# Patient Record
Sex: Female | Born: 1985 | Race: White | Hispanic: No | Marital: Single | Smoking: Current some day smoker
Health system: Southern US, Community
[De-identification: ages and names within clinical notes are randomized; demographics above are authoritative.]

## PROBLEM LIST (undated history)

## (undated) DIAGNOSIS — F329 Major depressive disorder, single episode, unspecified: Secondary | ICD-10-CM

## (undated) DIAGNOSIS — F4310 Post-traumatic stress disorder, unspecified: Secondary | ICD-10-CM

## (undated) DIAGNOSIS — F419 Anxiety disorder, unspecified: Secondary | ICD-10-CM

## (undated) DIAGNOSIS — N809 Endometriosis, unspecified: Secondary | ICD-10-CM

## (undated) DIAGNOSIS — F32A Depression, unspecified: Secondary | ICD-10-CM

---
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
## 2009-05-14 ENCOUNTER — Ambulatory Visit: Payer: Self-pay | Admitting: Internal Medicine

## 2014-06-22 ENCOUNTER — Telehealth: Payer: Self-pay | Admitting: *Deleted

## 2014-06-22 ENCOUNTER — Emergency Department (HOSPITAL_COMMUNITY)
Admission: EM | Admit: 2014-06-22 | Discharge: 2014-06-22 | Disposition: A | Discharge status: Home / Self Care | Payer: BLUE CROSS/BLUE SHIELD | Attending: Emergency Medicine | Admitting: Emergency Medicine

## 2014-06-22 ENCOUNTER — Emergency Department (HOSPITAL_COMMUNITY): Payer: BLUE CROSS/BLUE SHIELD

## 2014-06-22 ENCOUNTER — Encounter (HOSPITAL_COMMUNITY): Payer: Self-pay

## 2014-06-22 DIAGNOSIS — N3000 Acute cystitis without hematuria: Secondary | ICD-10-CM | POA: Insufficient documentation

## 2014-06-22 DIAGNOSIS — R0602 Shortness of breath: Secondary | ICD-10-CM

## 2014-06-22 DIAGNOSIS — R42 Dizziness and giddiness: Secondary | ICD-10-CM

## 2014-06-22 DIAGNOSIS — Z79899 Other long term (current) drug therapy: Secondary | ICD-10-CM | POA: Diagnosis not present

## 2014-06-22 DIAGNOSIS — Z791 Long term (current) use of non-steroidal anti-inflammatories (NSAID): Secondary | ICD-10-CM | POA: Diagnosis not present

## 2014-06-22 DIAGNOSIS — R5383 Other fatigue: Secondary | ICD-10-CM | POA: Diagnosis not present

## 2014-06-22 DIAGNOSIS — Z3202 Encounter for pregnancy test, result negative: Secondary | ICD-10-CM | POA: Insufficient documentation

## 2014-06-22 DIAGNOSIS — Z720 Tobacco use: Secondary | ICD-10-CM | POA: Insufficient documentation

## 2014-06-22 HISTORY — DX: Depression, unspecified: F32.A

## 2014-06-22 HISTORY — DX: Anxiety disorder, unspecified: F41.9

## 2014-06-22 HISTORY — DX: Major depressive disorder, single episode, unspecified: F32.9

## 2014-06-22 HISTORY — DX: Endometriosis, unspecified: N80.9

## 2014-06-22 LAB — URINALYSIS, ROUTINE W REFLEX MICROSCOPIC
Bilirubin Urine: NEGATIVE
Glucose, UA: NEGATIVE mg/dL
KETONES UR: NEGATIVE mg/dL
Nitrite: NEGATIVE
PROTEIN: 30 mg/dL — AB
Specific Gravity, Urine: 1.022 (ref 1.005–1.030)
Urobilinogen, UA: 1 mg/dL (ref 0.0–1.0)
pH: 7 (ref 5.0–8.0)

## 2014-06-22 LAB — BASIC METABOLIC PANEL
Anion gap: 13 (ref 5–15)
BUN: 14 mg/dL (ref 6–23)
CO2: 20 mmol/L (ref 19–32)
Calcium: 9.7 mg/dL (ref 8.4–10.5)
Chloride: 106 mmol/L (ref 96–112)
Creatinine, Ser: 0.71 mg/dL (ref 0.50–1.10)
GFR calc Af Amer: >90 mL/min (ref >90)
GFR calc non Af Amer: >90 mL/min (ref >90)
Glucose, Bld: 111 mg/dL — ABNORMAL HIGH (ref 70–99)
Potassium: 3.5 mmol/L (ref 3.5–5.1)
Sodium: 139 mmol/L (ref 135–145)

## 2014-06-22 LAB — URINE MICROSCOPIC-ADD ON

## 2014-06-22 LAB — CBC
HCT: 39.5 % (ref 36.0–46.0)
Hemoglobin: 13.7 g/dL (ref 12.0–15.0)
MCH: 31.5 pg (ref 26.0–34.0)
MCHC: 34.7 g/dL (ref 30.0–36.0)
MCV: 90.8 fL (ref 78.0–100.0)
Platelets: 221 10*3/uL (ref 150–400)
RBC: 4.35 MIL/uL (ref 3.87–5.11)
RDW: 11.9 % (ref 11.5–15.5)
WBC: 6.1 10*3/uL (ref 4.0–10.5)

## 2014-06-22 LAB — POC URINE PREG, ED: Preg Test, Ur: NEGATIVE

## 2014-06-22 MED ORDER — SODIUM CHLORIDE 0.9 % IV BOLUS (SEPSIS)
1000.0000 mL | Freq: Once | INTRAVENOUS | Status: AC
  Administered 2014-06-22: 1000 mL via INTRAVENOUS

## 2014-06-22 MED ORDER — MECLIZINE HCL 32 MG PO TABS: 32.0000 mg | ORAL_TABLET | Freq: Three times a day (TID) | ORAL | Status: AC | PRN

## 2014-06-22 MED ORDER — PROCHLORPERAZINE EDISYLATE 5 MG/ML IJ SOLN
10.0000 mg | Freq: Once | INTRAMUSCULAR | Status: AC
  Administered 2014-06-22: 10 mg via INTRAVENOUS
  Filled 2014-06-22: qty 2

## 2014-06-22 MED ORDER — CEPHALEXIN 500 MG PO CAPS: 500.0000 mg | ORAL_CAPSULE | Freq: Three times a day (TID) | ORAL | Status: AC

## 2014-06-22 MED ORDER — LORAZEPAM 2 MG/ML IJ SOLN
0.5000 mg | Freq: Once | INTRAMUSCULAR | Status: AC
  Administered 2014-06-22: 0.5 mg via INTRAVENOUS
  Filled 2014-06-22: qty 1

## 2014-06-22 MED ORDER — DIPHENHYDRAMINE HCL 50 MG/ML IJ SOLN
25.0000 mg | Freq: Once | INTRAMUSCULAR | Status: AC
  Administered 2014-06-22: 25 mg via INTRAVENOUS
  Filled 2014-06-22: qty 1

## 2014-06-22 MED ORDER — LIDOCAINE HCL (PF) 1 % IJ SOLN
INTRAMUSCULAR | Status: AC
  Administered 2014-06-22: 1.6 mL
  Filled 2014-06-22: qty 5

## 2014-06-22 MED ORDER — CEFTRIAXONE SODIUM 1 G IJ SOLR
1.0000 g | Freq: Once | INTRAMUSCULAR | Status: AC
  Administered 2014-06-22: 1 g via INTRAMUSCULAR
  Filled 2014-06-22: qty 10

## 2014-06-22 MED ORDER — KETOROLAC TROMETHAMINE 30 MG/ML IJ SOLN
30.0000 mg | Freq: Once | INTRAMUSCULAR | Status: AC
  Administered 2014-06-22: 30 mg via INTRAVENOUS
  Filled 2014-06-22 (×2): qty 1

## 2014-06-22 NOTE — ED Notes (Signed)
Pt alert, oriented, and ambulatory upon DC.  She was advised to follow up with PCP. 

## 2014-06-22 NOTE — Discharge Instructions (Signed)
Dizziness °Dizziness is a common problem. It is a feeling of unsteadiness or light-headedness. You may feel like you are about to faint. Dizziness can lead to injury if you stumble or fall. A person of any age group can suffer from dizziness, but dizziness is more common in older adults. °CAUSES  °Dizziness can be caused by many different things, including: °· Middle ear problems. °· Standing for too long. °· Infections. °· An allergic reaction. °· Aging. °· An emotional response to something, such as the sight of blood. °· Side effects of medicines. °· Tiredness. °· Problems with circulation or blood pressure. °· Excessive use of alcohol or medicines, or illegal drug use. °· Breathing too fast (hyperventilation). °· An irregular heart rhythm (arrhythmia). °· A low red blood cell count (anemia). °· Pregnancy. °· Vomiting, diarrhea, fever, or other illnesses that cause body fluid loss (dehydration). °· Diseases or conditions such as Parkinson's disease, high blood pressure (hypertension), diabetes, and thyroid problems. °· Exposure to extreme heat. °DIAGNOSIS  °Your health care provider will ask about your symptoms, perform a physical exam, and perform an electrocardiogram (ECG) to record the electrical activity of your heart. Your health care provider may also perform other heart or blood tests to determine the cause of your dizziness. These may include: °· Transthoracic echocardiogram (TTE). During echocardiography, sound waves are used to evaluate how blood flows through your heart. °· Transesophageal echocardiogram (TEE). °· Cardiac monitoring. This allows your health care provider to monitor your heart rate and rhythm in real time. °· Holter monitor. This is a portable device that records your heartbeat and can help diagnose heart arrhythmias. It allows your health care provider to track your heart activity for several days if needed. °· Stress tests by exercise or by giving medicine that makes the heart beat  faster. °TREATMENT  °Treatment of dizziness depends on the cause of your symptoms and can vary greatly. °HOME CARE INSTRUCTIONS  °· Drink enough fluids to keep your urine clear or pale yellow. This is especially important in very hot weather. In older adults, it is also important in cold weather. °· Take your medicine exactly as directed if your dizziness is caused by medicines. When taking blood pressure medicines, it is especially important to get up slowly. °· Rise slowly from chairs and steady yourself until you feel okay. °· In the morning, first sit up on the side of the bed. When you feel okay, stand slowly while holding onto something until you know your balance is fine. °· Move your legs often if you need to stand in one place for a long time. Tighten and relax your muscles in your legs while standing. °· Have someone stay with you for 1-2 days if dizziness continues to be a problem. Do this until you feel you are well enough to stay alone. Have the person call your health care provider if he or she notices changes in you that are concerning. °· Do not drive or use heavy machinery if you feel dizzy. °· Do not drink alcohol. °SEEK IMMEDIATE MEDICAL CARE IF:  °· Your dizziness or light-headedness gets worse. °· You feel nauseous or vomit. °· You have problems talking, walking, or using your arms, hands, or legs. °· You feel weak. °· You are not thinking clearly or you have trouble forming sentences. It may take a friend or family member to notice this. °· You have chest pain, abdominal pain, shortness of breath, or sweating. °· Your vision changes. °· You notice   any bleeding.  You have side effects from medicine that seems to be getting worse rather than better. MAKE SURE YOU:   Understand these instructions.  Will watch your condition.  Will get help right away if you are not doing well or get worse. Document Released: 09/17/2000 Document Revised: 03/29/2013 Document Reviewed: 10/11/2010 [facility]  Patient Information 2015 [place], [place]. This information is not intended to replace advice given to you by your health care provider. Make sure you discuss any questions you have with your health care provider.  Urinary Tract Infection Urinary tract infections (UTIs) can develop anywhere along your urinary tract. Your urinary tract is your body's drainage system for removing wastes and extra water. Your urinary tract includes two kidneys, two ureters, a bladder, and a urethra. Your kidneys are a pair of bean-shaped organs. Each kidney is about the size of your fist. They are located below your ribs, one on each side of your spine. CAUSES Infections are caused by microbes, which are microscopic organisms, including fungi, viruses, and bacteria. These organisms are so small that they can only be seen through a microscope. Bacteria are the microbes that most commonly cause UTIs. SYMPTOMS  Symptoms of UTIs may vary by age and gender of the patient and by the location of the infection. Symptoms in young women typically include a frequent and intense urge to urinate and a painful, burning feeling in the bladder or urethra during urination. Older women and men are more likely to be tired, shaky, and weak and have muscle aches and abdominal pain. A fever may mean the infection is in your kidneys. Other symptoms of a kidney infection include pain in your back or sides below the ribs, nausea, and vomiting. DIAGNOSIS To diagnose a UTI, your caregiver will ask you about your symptoms. Your caregiver also will ask to provide a urine sample. The urine sample will be tested for bacteria and white blood cells. White blood cells are made by your body to help fight infection. TREATMENT  Typically, UTIs can be treated with medication. Because most UTIs are caused by a bacterial infection, they usually can be treated with the use of antibiotics. The choice of antibiotic and length of treatment depend on your symptoms and  the type of bacteria causing your infection. HOME CARE INSTRUCTIONS  If you were prescribed antibiotics, take them exactly as your caregiver instructs you. Finish the medication even if you feel better after you have only taken some of the medication.  Drink enough water and fluids to keep your urine clear or pale yellow.  Avoid caffeine, tea, and carbonated beverages. They tend to irritate your bladder.  Empty your bladder often. Avoid holding urine for long periods of time.  Empty your bladder before and after sexual intercourse.  After a bowel movement, women should cleanse from front to back. Use each tissue only once. SEEK MEDICAL CARE IF:   You have back pain.  You develop a fever.  Your symptoms do not begin to resolve within 3 days. SEEK IMMEDIATE MEDICAL CARE IF:   You have severe back pain or lower abdominal pain.  You develop chills.  You have nausea or vomiting.  You have continued burning or discomfort with urination. MAKE SURE YOU:   Understand these instructions.  Will watch your condition.  Will get help right away if you are not doing well or get worse. Document Released: 01/01/2005 Document Revised: 09/23/2011 Document Reviewed: 05/02/2011 [facility] Patient Information 2015 [place], [place]. This information is not  intended to replace advice given to you by your health care provider. Make sure you discuss any questions you have with your health care provider.  Shortness of Breath Shortness of breath means you have trouble breathing. It could also mean that you have a medical problem. You should get immediate medical care for shortness of breath. CAUSES   Not enough oxygen in the air such as with high altitudes or a smoke-filled room.  Certain lung diseases, infections, or problems.  Heart disease or conditions, such as angina or heart failure.  Low red blood cells (anemia).  Poor physical fitness, which can cause shortness of breath when you  exercise.  Chest or back injuries or stiffness.  Being overweight.  Smoking.  Anxiety, which can make you feel like you are not getting enough air. DIAGNOSIS  Serious medical problems can often be found during your physical exam. Tests may also be done to determine why you are having shortness of breath. Tests may include:  Chest X-rays.  Lung function tests.  Blood tests.  An electrocardiogram (ECG).  An ambulatory electrocardiogram. An ambulatory ECG records your heartbeat patterns over a 24-hour period.  Exercise testing.  A transthoracic echocardiogram (TTE). During echocardiography, sound waves are used to evaluate how blood flows through your heart.  A transesophageal echocardiogram (TEE).  Imaging scans. Your health care provider may not be able to find a cause for your shortness of breath after your exam. In this case, it is important to have a follow-up exam with your health care provider as directed.  TREATMENT  Treatment for shortness of breath depends on the cause of your symptoms and can vary greatly. HOME CARE INSTRUCTIONS   Do not smoke. Smoking is a common cause of shortness of breath. If you smoke, ask for help to quit.  Avoid being around chemicals or things that may bother your breathing, such as paint fumes and dust.  Rest as needed. Slowly resume your usual activities.  If medicines were prescribed, take them as directed for the full length of time directed. This includes oxygen and any inhaled medicines.  Keep all follow-up appointments as directed by your health care provider. SEEK MEDICAL CARE IF:   Your condition does not improve in the time expected.  You have a hard time doing your normal activities even with rest.  You have any new symptoms. SEEK IMMEDIATE MEDICAL CARE IF:   Your shortness of breath gets worse.  You feel light-headed, faint, or develop a cough not controlled with medicines.  You start coughing up blood.  You have  pain with breathing.  You have chest pain or pain in your arms, shoulders, or abdomen.  You have a fever.  You are unable to walk up stairs or exercise the way you normally do. MAKE SURE YOU:  Understand these instructions.  Will watch your condition.  Will get help right away if you are not doing well or get worse. Document Released: 12/17/2000 Document Revised: 03/29/2013 Document Reviewed: 06/09/2011 [facility] Patient Information 2015 [place]. This information is not intended to replace advice given to you by your health care provider. Make sure you discuss any questions you have with your health care provider.

## 2014-06-22 NOTE — ED Notes (Signed)
Contacted by [facility] regarding prescription for Meclazine 32mg , dose not available.  Change Mecalzine dose to 25 mg per Langston MaskerKaren Sofia, [place]
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
## 2014-06-23 LAB — URINE CULTURE
Colony Count: NO GROWTH
Culture: NO GROWTH

## 2016-06-29 ENCOUNTER — Emergency Department (HOSPITAL_COMMUNITY): Payer: BLUE CROSS/BLUE SHIELD

## 2016-06-29 ENCOUNTER — Emergency Department (HOSPITAL_COMMUNITY)
Admission: EM | Admit: 2016-06-29 | Discharge: 2016-06-29 | Disposition: A | Discharge status: Home / Self Care | Payer: BLUE CROSS/BLUE SHIELD | Attending: Emergency Medicine | Admitting: Emergency Medicine

## 2016-06-29 ENCOUNTER — Encounter (HOSPITAL_COMMUNITY): Payer: Self-pay

## 2016-06-29 DIAGNOSIS — E860 Dehydration: Secondary | ICD-10-CM | POA: Diagnosis not present

## 2016-06-29 DIAGNOSIS — R5383 Other fatigue: Secondary | ICD-10-CM | POA: Diagnosis present

## 2016-06-29 DIAGNOSIS — F17210 Nicotine dependence, cigarettes, uncomplicated: Secondary | ICD-10-CM | POA: Diagnosis not present

## 2016-06-29 HISTORY — DX: Post-traumatic stress disorder, unspecified: F43.10

## 2016-06-29 LAB — CBC WITH DIFFERENTIAL/PLATELET
Basophils Absolute: 0 10*3/uL (ref 0.0–0.1)
Basophils Relative: 0 %
Eosinophils Absolute: 0 10*3/uL (ref 0.0–0.7)
Eosinophils Relative: 1 %
HCT: 40.1 % (ref 36.0–46.0)
Hemoglobin: 14.6 g/dL (ref 12.0–15.0)
Lymphocytes Relative: 34 %
Lymphs Abs: 2.3 10*3/uL (ref 0.7–4.0)
MCH: 31.7 pg (ref 26.0–34.0)
MCHC: 36.4 g/dL — ABNORMAL HIGH (ref 30.0–36.0)
MCV: 87 fL (ref 78.0–100.0)
Monocytes Absolute: 0.5 10*3/uL (ref 0.1–1.0)
Monocytes Relative: 7 %
Neutro Abs: 3.9 10*3/uL (ref 1.7–7.7)
Neutrophils Relative %: 58 %
Platelets: 246 10*3/uL (ref 150–400)
RBC: 4.61 MIL/uL (ref 3.87–5.11)
RDW: 11.4 % — ABNORMAL LOW (ref 11.5–15.5)
WBC: 6.8 10*3/uL (ref 4.0–10.5)

## 2016-06-29 LAB — I-STAT BETA HCG BLOOD, ED (MC, WL, AP ONLY): I-stat hCG, quantitative: <5 m[IU]/mL (ref <5)

## 2016-06-29 LAB — BASIC METABOLIC PANEL
Anion gap: 7 (ref 5–15)
BUN: 12 mg/dL (ref 6–20)
CO2: 26 mmol/L (ref 22–32)
Calcium: 10.1 mg/dL (ref 8.9–10.3)
Chloride: 105 mmol/L (ref 101–111)
Creatinine, Ser: 0.76 mg/dL (ref 0.44–1.00)
GFR calc Af Amer: >60 mL/min (ref >60)
GFR calc non Af Amer: >60 mL/min (ref >60)
Glucose, Bld: 96 mg/dL (ref 65–99)
Potassium: 3.6 mmol/L (ref 3.5–5.1)
Sodium: 138 mmol/L (ref 135–145)

## 2016-06-29 MED ORDER — LORAZEPAM 1 MG PO TABS
1.0000 mg | ORAL_TABLET | Freq: Once | ORAL | Status: AC
  Administered 2016-06-29: 1 mg via ORAL
  Filled 2016-06-29: qty 1

## 2016-06-29 MED ORDER — SODIUM CHLORIDE 0.9 % IV BOLUS (SEPSIS)
1000.0000 mL | Freq: Once | INTRAVENOUS | Status: AC
  Administered 2016-06-29: 1000 mL via INTRAVENOUS

## 2016-06-29 NOTE — ED Triage Notes (Signed)
Pt had oral surgery, wisdom out on Tuesday.  Today she feels she has been weak and dizzy.  Pt told to go to [facility]. Pt states they couldn't really do much so she was told to come here.  Pt states she feels cold then hot.  Pt has no n/v.  Pt has decreased intake d/t surgery.

## 2016-06-29 NOTE — ED Provider Notes (Signed)
[facility]-EMERGENCY DEPT Provider Note   CSN: [id number] Arrival date & time: 06/29/16  1154     History   Chief Complaint Chief Complaint  Patient presents with  . Weakness    HPI Beverly Lamb is a 31 y.o. female who presents today accompanied by husband with chief complaint generalized fatigue for 5 days. States she had her lower wisdom teeth removed on Tuesday and has been experiencing fatigue since then, accompanied by HA/dizziness with changing positions and occasional nausea, as well as chills. Denies syncope, vomiting. States she has been taking her percocet and ibuprofen as prescribed for pain and is not experiencing any discomfort from the surgical site. States she wants to sleep every 2 hours or so. She also states she has been eating and drinking about 1/3 of her normal PO intake. Called her orthodontist today who advised her to go to urgent care and states "they could not do anything for me there". Denies CP/SOB, abd pain, v/d, melena, hematochezia, dysuria, hematuria, back pain, myalgia, weakness.   The history is provided by the patient.    Past Medical History:  Diagnosis Date  . Anxiety   . Depression   . Endometriosis   . PTSD (post-traumatic stress disorder)     There are no active problems to display for this patient.   No past surgical history on file.  OB History    No data available       Home Medications    Prior to Admission medications   Medication Sig Start Date End Date Taking? Authorizing Provider  amoxicillin (AMOXIL) 875 MG tablet Take 875 mg by mouth every 12 (twelve) hours. 06/24/16  Yes Historical Provider, MD  HYDROcodone-acetaminophen (NORCO/VICODIN) 5-325 MG per tablet Take 1 tablet by mouth every 6 (six) hours as needed for moderate pain.   Yes Historical Provider, MD  ibuprofen (ADVIL,MOTRIN) 600 MG tablet Take 600 mg by mouth every 6 (six) hours as needed for pain. 06/24/16  Yes Historical Provider, MD  cephALEXin (KEFLEX) 500 MG  capsule Take 1 capsule (500 mg total) by mouth 3 (three) times daily. Patient not taking: Reported on 06/29/2016 06/22/14   Arthor CaptainAbigail Harris, PA-C  meclizine (ANTIVERT) 32 MG tablet Take 1 tablet (32 mg total) by mouth 3 (three) times daily as needed. Patient not taking: Reported on 06/29/2016 06/22/14   Arthor CaptainAbigail Harris, PA-C  ondansetron (ZOFRAN-ODT) 4 MG disintegrating tablet Take 4 mg by mouth every 8 (eight) hours as needed for nausea/vomiting. 06/24/16   Historical Provider, MD    Family History No family history on file.  Social History Social History  Substance Use Topics  . Smoking status: Current Some Day Smoker    Types: Cigarettes  . Smokeless tobacco: Never Used  . Alcohol use Yes     Comment: occ     Allergies   Bee venom; Shellfish-derived products; and Codeine   Review of Systems Review of Systems  Constitutional: Positive for chills and fatigue. Negative for fever.  HENT: Negative for congestion and mouth sores.   Respiratory: Negative for shortness of breath.   Cardiovascular: Negative for chest pain.  Gastrointestinal: Positive for nausea. Negative for abdominal pain, constipation and diarrhea.  Genitourinary: Negative for dysuria and hematuria.  Musculoskeletal: Negative for back pain and myalgias.  Skin: Negative for pallor.  Neurological: Negative for syncope.     Physical Exam Updated Vital Signs BP 108/67 (BP Location: Left Arm)   Pulse 81   Temp 98.6 F (37 C) (Oral)  Resp 12   Ht 5\' 8"  (1.727 m)   Wt 55.9 kg   LMP 06/08/2016   SpO2 97%   BMI 18.73 kg/m   Physical Exam  Constitutional: She appears well-developed and well-nourished. No distress.  Appears sleepy  HENT:  Head: Normocephalic and atraumatic.  Mouth/Throat: Oropharynx is clear and moist. No oropharyngeal exudate.  No bleeding, purulence, or dehiscence noted from posterior surgical sites, appear to be well-healing, no uvular deviation, no pharyngeal erythema.   Eyes: Conjunctivae  and EOM are normal. Right eye exhibits no discharge. Left eye exhibits no discharge. No scleral icterus.  No palpebral conjunctival pallor  Neck: No JVD present. No tracheal deviation present. No thyromegaly present.  Cardiovascular: Normal rate, regular rhythm, normal heart sounds and intact distal pulses.   Pulmonary/Chest: Effort normal and breath sounds normal.  Abdominal: Soft. Bowel sounds are normal. She exhibits no distension. There is no tenderness.  Musculoskeletal: Normal range of motion. She exhibits no tenderness.  Neurological: She is alert. Gait abnormal. GCS eye subscore is 4. GCS verbal subscore is 5. GCS motor subscore is 6.  Unsteady gait  Skin: Skin is warm and dry. Capillary refill takes 2 to 3 seconds. She is not diaphoretic. No pallor.  No skin tenting  Psychiatric: She has a normal mood and affect. Her behavior is normal. Judgment and thought content normal.     ED Treatments / Results  Labs (all labs ordered are listed, but only abnormal results are displayed) Labs Reviewed  CBC WITH DIFFERENTIAL/PLATELET - Abnormal; Notable for the following:       Result Value   MCHC 36.4 (*)    RDW 11.4 (*)    All other components within normal limits  BASIC METABOLIC PANEL  I-STAT BETA HCG BLOOD, ED ([facility], [facility], [facility] ONLY)    EKG  EKG Interpretation None       Radiology No results found.  Procedures Procedures (including critical care time)  Medications Ordered in ED Medications  sodium chloride 0.9 % bolus 1,000 mL (0 mLs Intravenous Stopped 06/29/16 1503)     Initial Impression / Assessment and Plan / ED Course  I have reviewed the triage vital signs and the nursing notes.  Pertinent labs & imaging results that were available during my care of the patient were reviewed by me and considered in my medical decision making (see chart for details).     31yof presents to ED with chief complaint fatigue for 5 days s/p widsom teeth extraction. Endorses decreased  PO intake since procedure. 1L NS bolus given. Pt afebrile, VSS, surgical incisions appear to be healing well. CBC and BMP unremarkable, low suspicion of acute bleed or electrolyte imbalance causing symptoms. On exam pt's gait appears unsteady, and she states her father has hx of first stroke in early 54s "and I'm pretty sure he had strokes before then". Given fhx stroke and recent surgery, ordered MRI Brain WO contrast to rule out acute process. Ativan given for claustrophobia.   6:11 PM  Provided sign out to oncoming provider. If MRI is negative, no further emergent workup needed. Discussed with pt increasing PO intake and increasing activity as tolerated. Pt can follow up with dentist for re-evaluation. If MRI is positive for possible ischemic stroke or other acute process, pt will need to be brought in for further evaluation.   Pt seen and evaluated by Dr. Rhunette Croft.  Final Clinical Impressions(s) / ED Diagnoses   Final diagnoses:  Fatigue, unspecified type  Dehydration  New Prescriptions New Prescriptions   No medications on file     Jeanie Sewer, [place] 06/30/16 1615    Derwood Kaplan, MD 06/30/16 1719

## 2016-06-29 NOTE — ED Triage Notes (Signed)
Pt vs [facility]nl.  No chest pain.  Notified MD, Tegeler.  At this time, can hold EKG

## 2016-06-29 NOTE — ED Provider Notes (Signed)
Patient care signed off to me at shift change by previous provider pending MRI.  Please see previous provider note for full H&P.  Patient eating upon my evaluation.  Her MRI shows no significant findings, remainder laboratory analysis reassuring.  Patient had no further questions or concerns at the time discharge, she will follow-up with primary care, strict return precautions given.  She verbalized understanding and agreement to today's plan had no further questions or concerns  Vitals:   06/29/16 1203 06/29/16 1853  BP: 108/67 132/88  Pulse: 81 82  Resp: 12 16  Temp: 98.6 F (37 C)      Eyvonne MechanicJeffrey Areya Lemmerman, PA-C 06/29/16 1909    Gerhard Munchobert Lockwood, MD 07/01/16 [phone]

## 2016-06-29 NOTE — Discharge Instructions (Signed)
Try to increase your food and drink intake (water, ensure, pedialyte, gatorade etc). Increase your activity as tolerated. Return to the ED if you develop fever/chills, surgical site pain/discharge, pass out, or any other symptoms that are concerning. Tylenol or Ibuprofen every 4-6 hours as needed are okay for pain.

## 2018-04-04 IMAGING — MR MR HEAD W/O CM
11 of 12 series · 40 of 48 positions shown · non-contrast
Comparison: None.

CLINICAL DATA: 31-year-old female status post Oral surgery on
[REDACTED]. Weakness and dizziness today. Decreased p.o. intake.
Initial encounter.

EXAM:
MRI HEAD WITHOUT CONTRAST
TECHNIQUE: Multiplanar, multiecho pulse sequences of the brain and surrounding
structures were obtained without intravenous contrast.

[Series 3: T1 · sagittal · 5.0mm · 0.47mm/px · 3 of 26 slices shown]
[im 1/26]
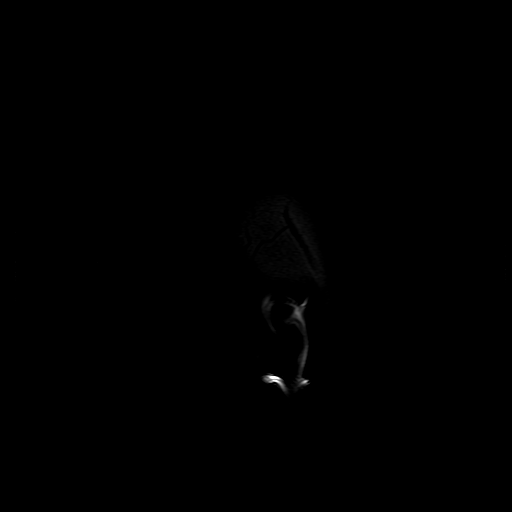
[im 13/26]
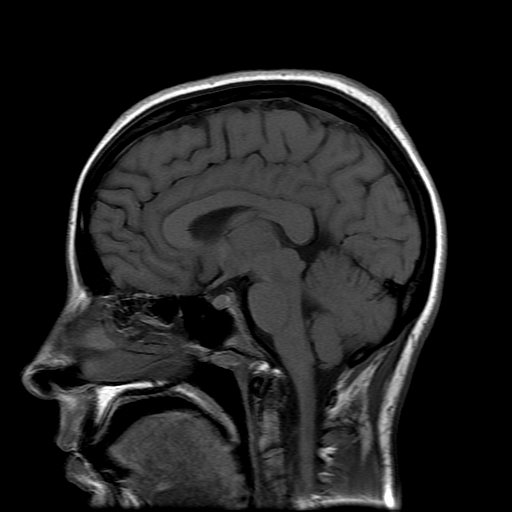
[im 26/26]
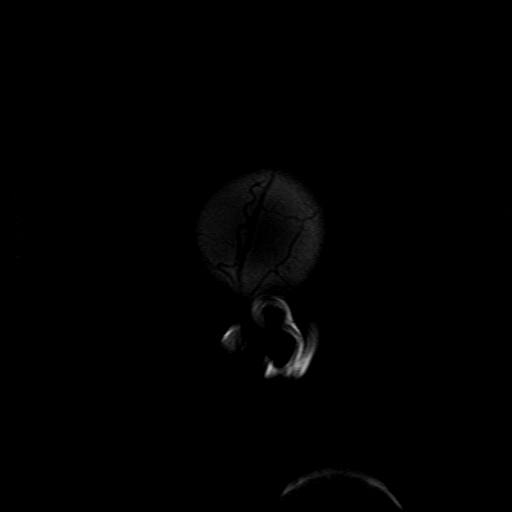

[Series 4: DWI · axial · 3.0mm · 1.09mm/px · z∈[-33,+129]mm · 8 of 110 slices shown (1 of 6)]
[im 1/110]
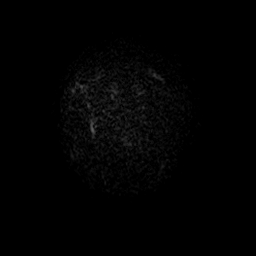
[im 13/110]
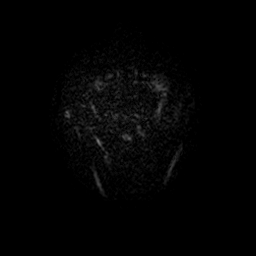
[im 37/110]
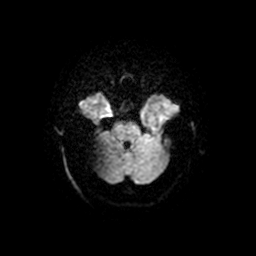
[im 49/110]
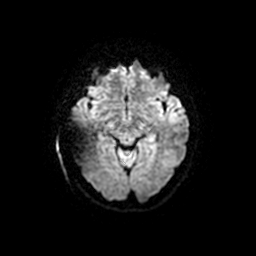
[im 61/110]
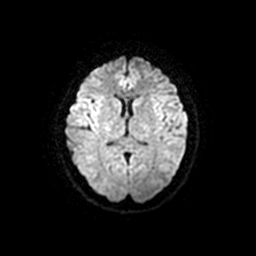
[im 73/110]
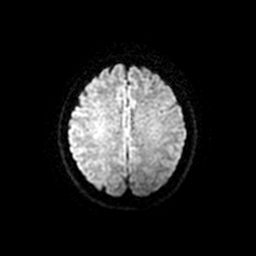
[im 97/110]
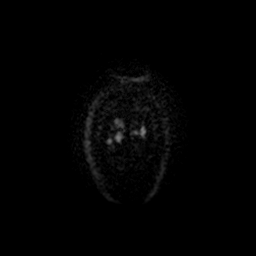
[im 110/110]
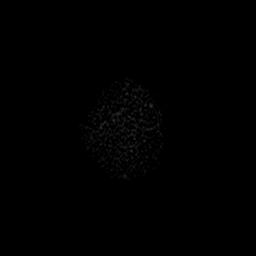

[Series 5: DWI · coronal · 5.0mm · 1.09mm/px · 6 of 70 slices shown (2 of 6)]
[im 1/70]
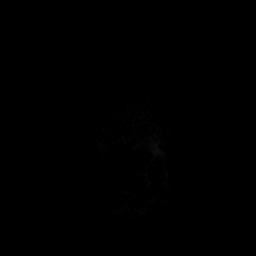
[im 14/70]
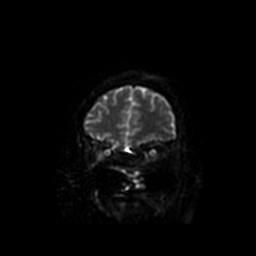
[im 28/70]
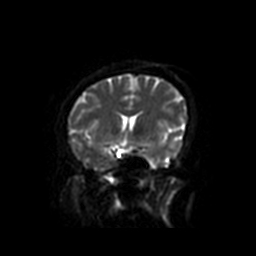
[im 42/70]
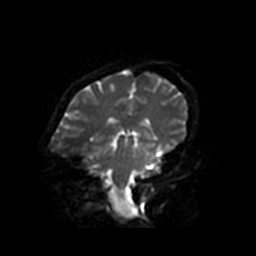
[im 56/70]
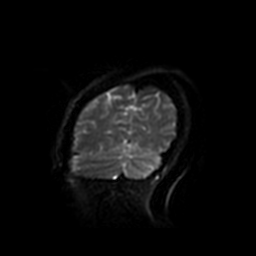
[im 70/70]
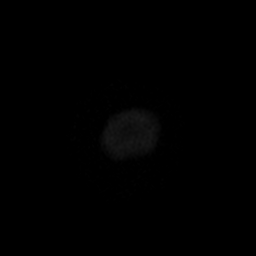

[Series 6: T2 · axial · 5.0mm · 0.43mm/px · z∈[-26,+121]mm · 2 of 22 slices shown (1 of 2)]
[im 1/22]
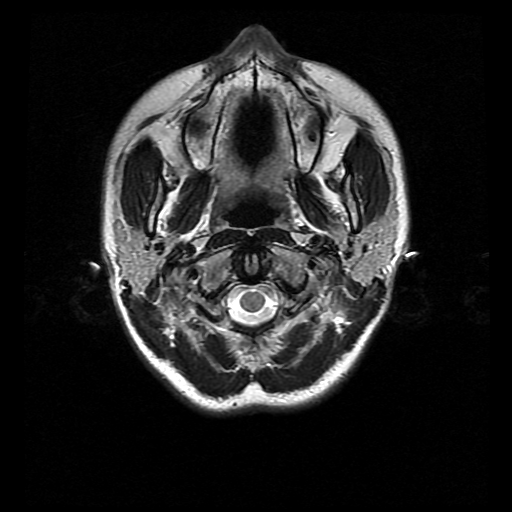
[im 22/22]
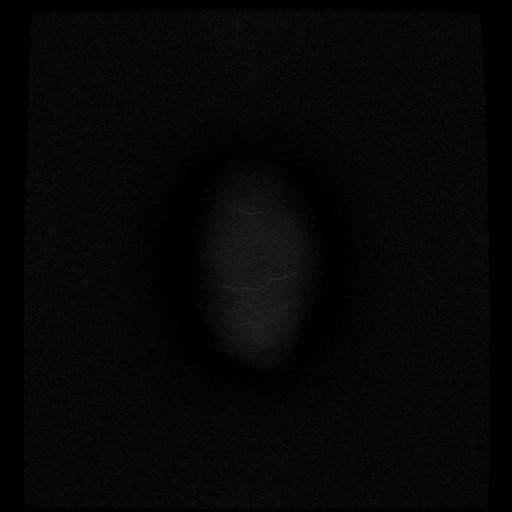

[Series 7: FLAIR · axial · 5.0mm · 0.43mm/px · z∈[-26,+121]mm · 2 of 22 slices shown]
[im 1/22]
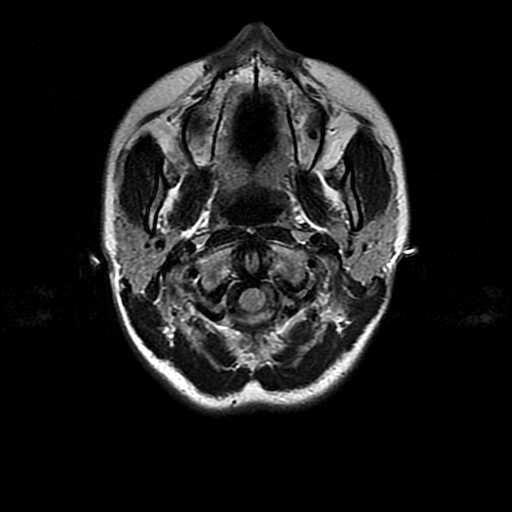
[im 22/22]
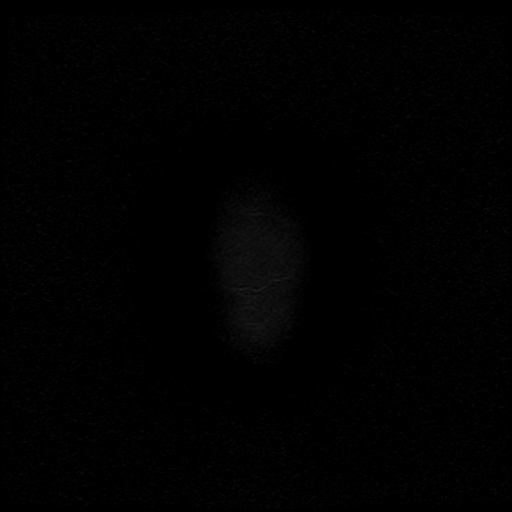

[Series 8: ax mpgr · axial · 5.0mm · 0.43mm/px · 1 of 22 slices shown]
[im 1/22]
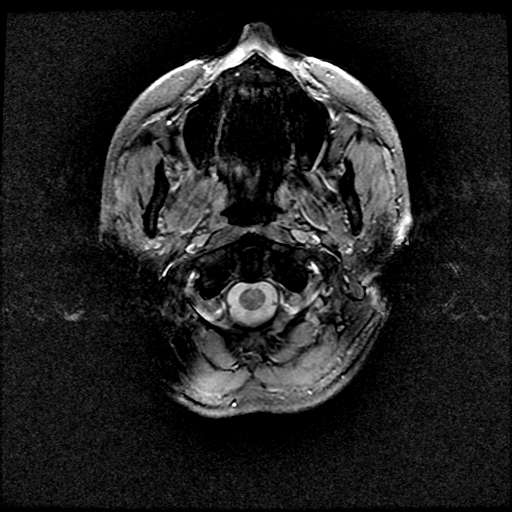

[Series 10: T2 · coronal · 5.0mm · 0.45mm/px · 2 of 27 slices shown (2 of 2)]
[im 1/27]
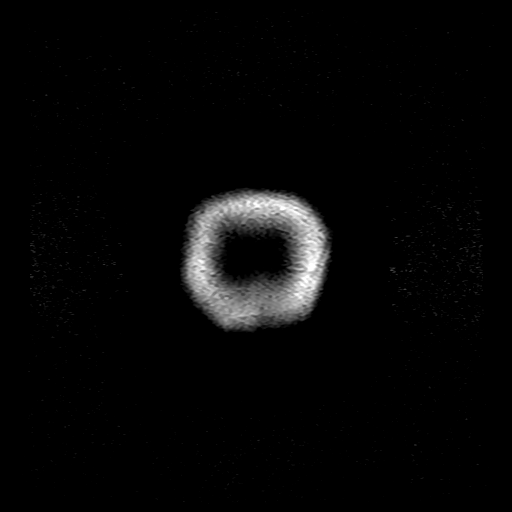
[im 27/27]
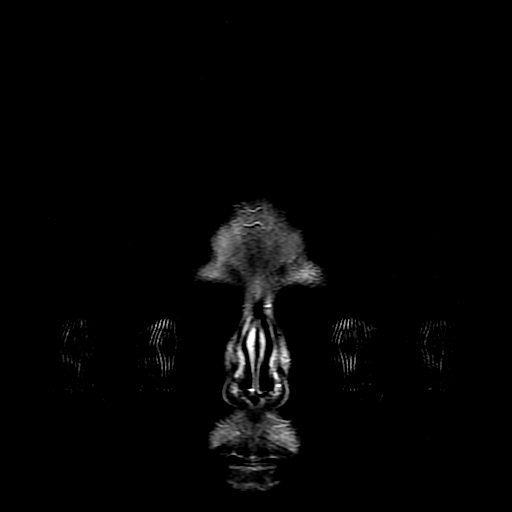

[Series 400: DWI · axial · 3.0mm · 1.09mm/px · z∈[-33,+123]mm · 5 of 53 slices shown (3 of 6)]
[im 1/53]
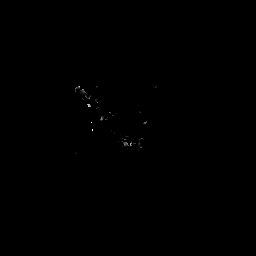
[im 14/53]
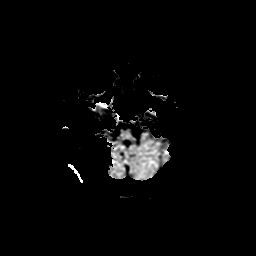
[im 27/53]
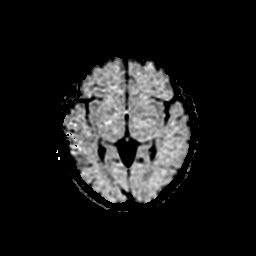
[im 40/53]
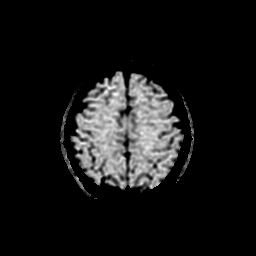
[im 53/53]
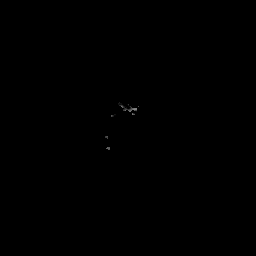

[Series 401: DWI · axial · 3.0mm · 1.09mm/px · z∈[-33,+123]mm · 5 of 53 slices shown (4 of 6)]
[im 1/53]
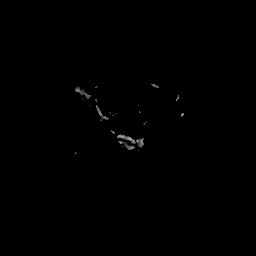
[im 14/53]
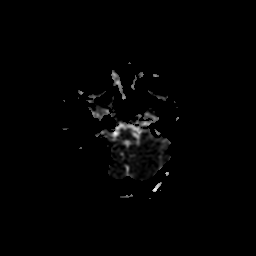
[im 27/53]
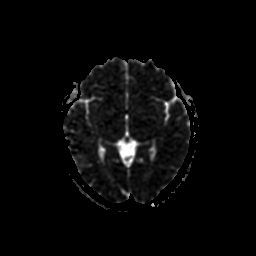
[im 40/53]
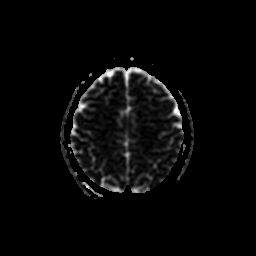
[im 53/53]
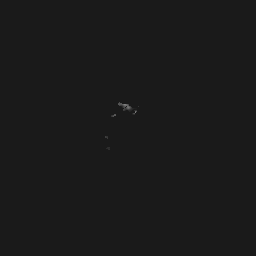

[Series 500: DWI · coronal · 5.0mm · 1.09mm/px · 3 of 35 slices shown (5 of 6)]
[im 1/35]
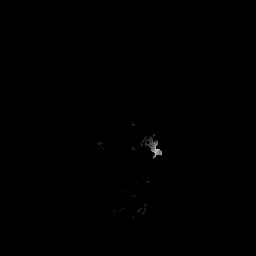
[im 18/35]
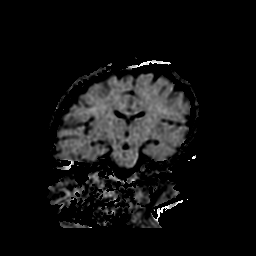
[im 35/35]
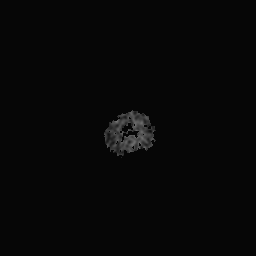

[Series 501: DWI · coronal · 5.0mm · 1.09mm/px · 3 of 35 slices shown (6 of 6)]
[im 1/35]
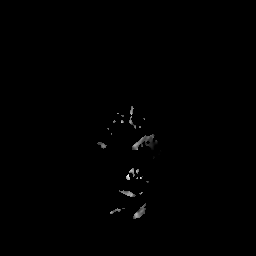
[im 18/35]
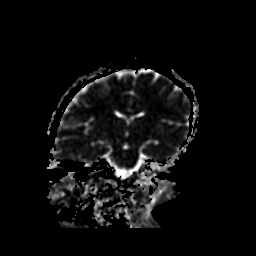
[im 35/35]
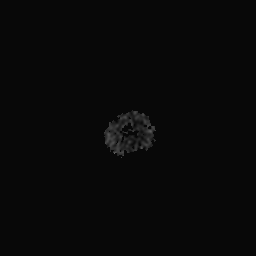

[40 of 48 positions shown; findings below may reference images not displayed]

FINDINGS: Brain: Susceptibility artifact related tear rings mildly degrades
diffusion imaging. No restricted diffusion to suggest acute
infarction. No midline shift, mass effect, evidence of mass lesion,
ventriculomegaly, extra-axial collection or acute intracranial
hemorrhage. Cervicomedullary junction and pituitary are within
normal limits. Cerebral volume is normal. Gray and white matter
signal is within normal limits throughout the brain.

Vascular: Major intracranial vascular flow voids are preserved and
appear normal.

Skull and upper cervical spine: Negative. Visualized bone marrow
signal is within normal limits.

Sinuses/Orbits: Normal orbits soft tissues. Paranasal sinuses are
clear.

Other: Mastoid air cells are clear. Visible internal auditory
structures appear normal. Negative scalp soft tissues.
IMPRESSION: Normal noncontrast MRI appearance of the brain.
